# Patient Record
Sex: Female | Born: 1980 | Race: White | Hispanic: No | Marital: Married | Smoking: Never smoker
Health system: Southern US, Community
[De-identification: ages and names within clinical notes are randomized; demographics above are authoritative.]

## PROBLEM LIST (undated history)

## (undated) DIAGNOSIS — M2550 Pain in unspecified joint: Secondary | ICD-10-CM

## (undated) DIAGNOSIS — M25561 Pain in right knee: Secondary | ICD-10-CM

## (undated) DIAGNOSIS — G8929 Other chronic pain: Secondary | ICD-10-CM

## (undated) DIAGNOSIS — Z79899 Other long term (current) drug therapy: Secondary | ICD-10-CM

## (undated) DIAGNOSIS — E063 Autoimmune thyroiditis: Secondary | ICD-10-CM

## (undated) DIAGNOSIS — G43909 Migraine, unspecified, not intractable, without status migrainosus: Secondary | ICD-10-CM

## (undated) HISTORY — PX: APPENDECTOMY: SHX54

## (undated) HISTORY — PX: IUD REMOVAL: SHX5392

## (undated) HISTORY — PX: CHOLECYSTECTOMY: SHX55

## (undated) HISTORY — PX: BACK SURGERY: SHX140

---

## 2002-05-21 ENCOUNTER — Encounter: Payer: Self-pay | Admitting: *Deleted

## 2002-05-21 ENCOUNTER — Inpatient Hospital Stay (HOSPITAL_COMMUNITY): Admission: AD | Admit: 2002-05-21 | Discharge: 2002-05-21 | Payer: Self-pay | Admitting: Obstetrics and Gynecology

## 2002-09-01 ENCOUNTER — Inpatient Hospital Stay (HOSPITAL_COMMUNITY): Admission: AD | Admit: 2002-09-01 | Discharge: 2002-09-01 | Payer: Self-pay | Admitting: *Deleted

## 2003-08-16 ENCOUNTER — Emergency Department (HOSPITAL_COMMUNITY): Admission: AD | Admit: 2003-08-16 | Discharge: 2003-08-16 | Payer: Self-pay | Admitting: Family Medicine

## 2003-10-19 ENCOUNTER — Emergency Department (HOSPITAL_COMMUNITY): Admission: EM | Admit: 2003-10-19 | Discharge: 2003-10-19 | Payer: Self-pay | Admitting: Family Medicine

## 2003-11-01 ENCOUNTER — Emergency Department (HOSPITAL_COMMUNITY): Admission: AD | Admit: 2003-11-01 | Discharge: 2003-11-01 | Payer: Self-pay | Admitting: Family Medicine

## 2003-11-02 ENCOUNTER — Emergency Department (HOSPITAL_COMMUNITY): Admission: EM | Admit: 2003-11-02 | Discharge: 2003-11-03 | Payer: Self-pay | Admitting: Emergency Medicine

## 2003-12-05 ENCOUNTER — Emergency Department (HOSPITAL_COMMUNITY): Admission: EM | Admit: 2003-12-05 | Discharge: 2003-12-05 | Payer: Self-pay | Admitting: Emergency Medicine

## 2004-02-01 ENCOUNTER — Emergency Department (HOSPITAL_COMMUNITY): Admission: EM | Admit: 2004-02-01 | Discharge: 2004-02-01 | Payer: Self-pay

## 2004-03-25 ENCOUNTER — Emergency Department (HOSPITAL_COMMUNITY): Admission: EM | Admit: 2004-03-25 | Discharge: 2004-03-25 | Payer: Self-pay | Admitting: Emergency Medicine

## 2004-05-14 ENCOUNTER — Emergency Department (HOSPITAL_COMMUNITY): Admission: EM | Admit: 2004-05-14 | Discharge: 2004-05-14 | Payer: Self-pay | Admitting: Emergency Medicine

## 2004-07-21 ENCOUNTER — Emergency Department (HOSPITAL_COMMUNITY): Admission: EM | Admit: 2004-07-21 | Discharge: 2004-07-21 | Payer: Self-pay | Admitting: Family Medicine

## 2005-02-17 ENCOUNTER — Inpatient Hospital Stay (HOSPITAL_COMMUNITY): Admission: AD | Admit: 2005-02-17 | Discharge: 2005-02-17 | Payer: Self-pay | Admitting: Family Medicine

## 2005-02-27 ENCOUNTER — Inpatient Hospital Stay (HOSPITAL_COMMUNITY): Admission: AD | Admit: 2005-02-27 | Discharge: 2005-02-28 | Payer: Self-pay | Admitting: Obstetrics & Gynecology

## 2005-11-04 IMAGING — US US OB COMP LESS 14 WK
1 series · 14 of 28 positions shown · non-contrast
Comparison: none

CLINICAL DATA: Early pregnancy.  Abdominal pain. 
 TRANSABDOMINAL AND TRANSVAGINAL OBSTETRICAL ULTRASOUND:
 There is an intrauterine pregnancy with the fetal pole with a crown/rump length of 13.7 mm consistent with 7 weeks, 5 days.   There is cardiac activity with a heart rate of 160 bpm. There is a large subchorionic hemorrhage measuring 4.6 x 0.9 x 2.7 cm.  The right ovary is normal.  There is a 2 cm corpus luteum cyst on the otherwise normal left ovary.  There is a small amount of fluid in the cul-de-sac.

[Series 1: us ob comp less 14 wk · 0.29mm/px · 14 of 50 slices shown]
[im 2/50]
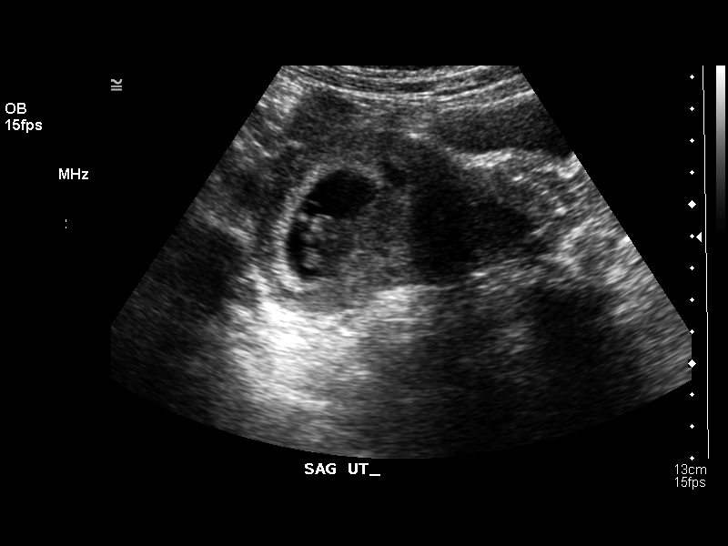
[im 6/50]
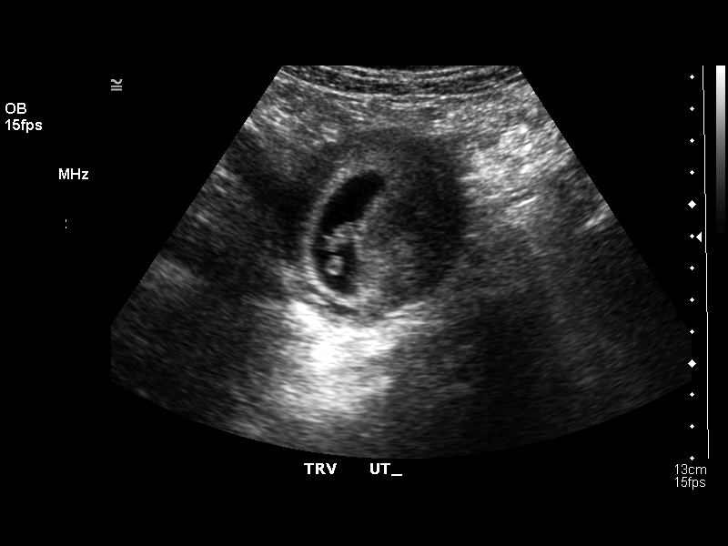
[im 10/50]
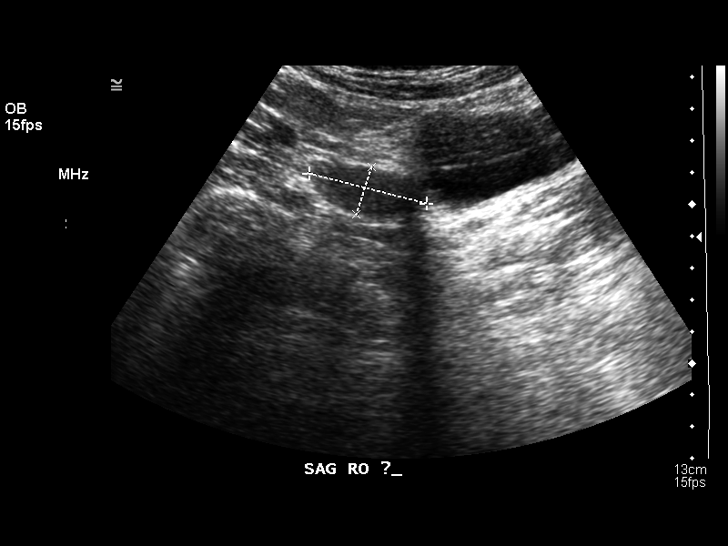
[im 13/50]
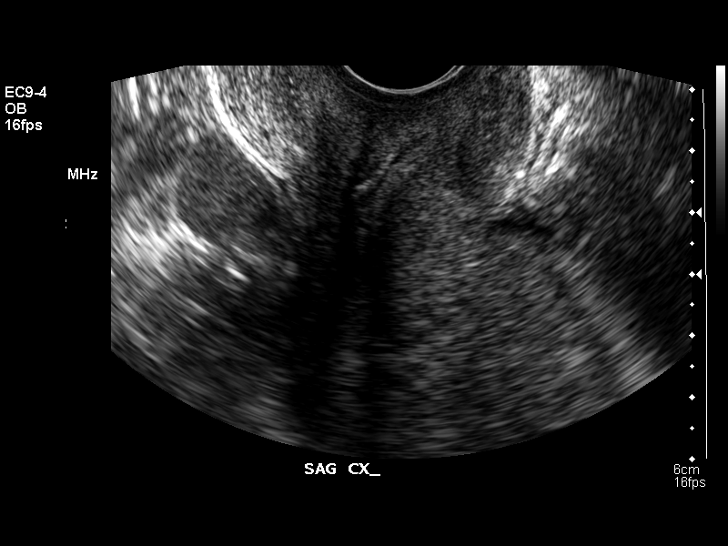
[im 17/50]
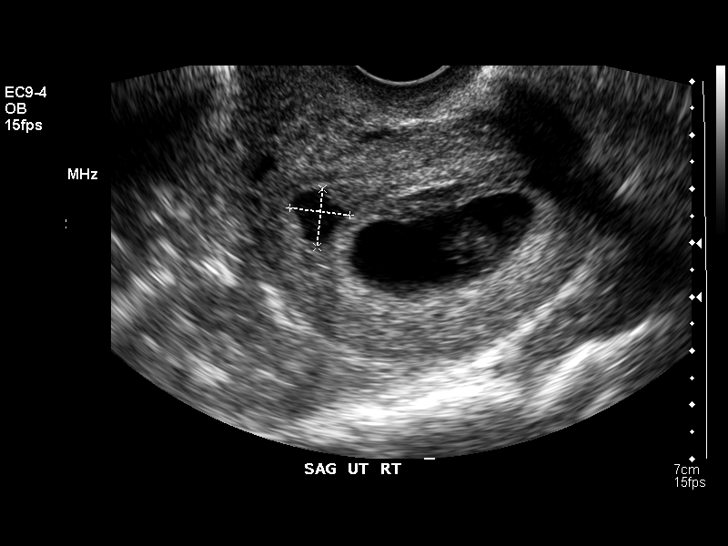
[im 20/50]
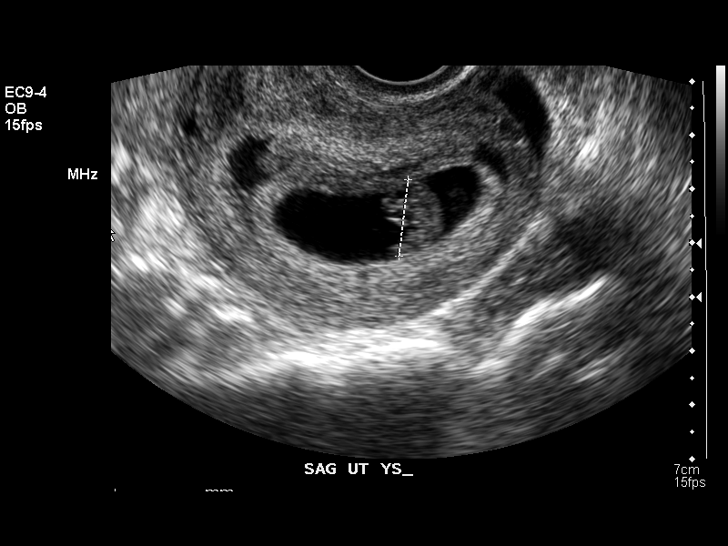
[im 24/50]
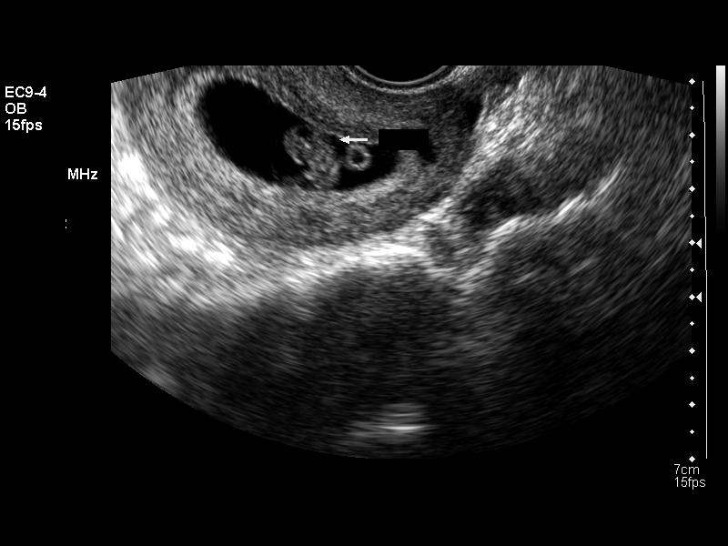
[im 28/50]
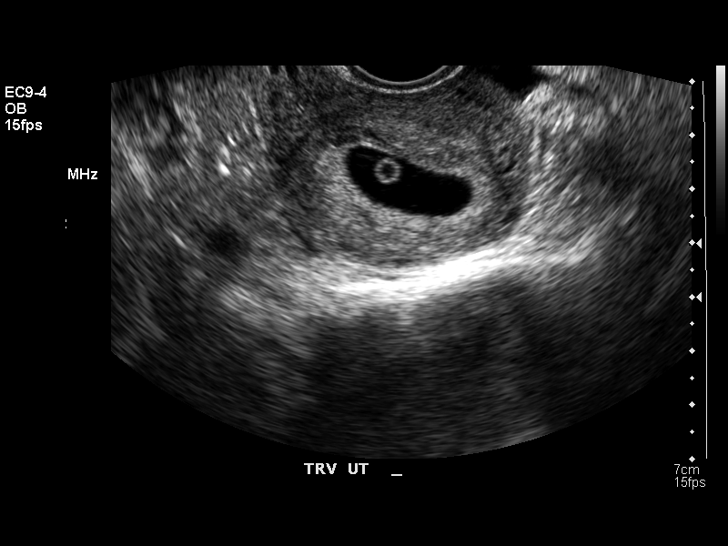
[im 31/50]
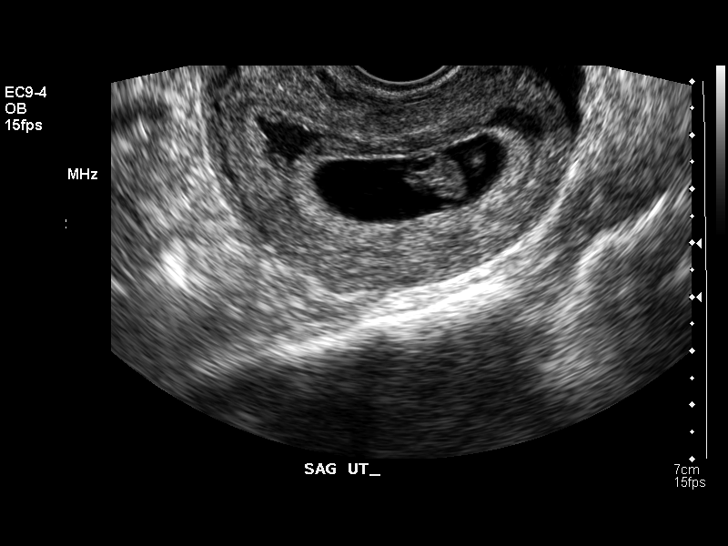
[im 35/50]
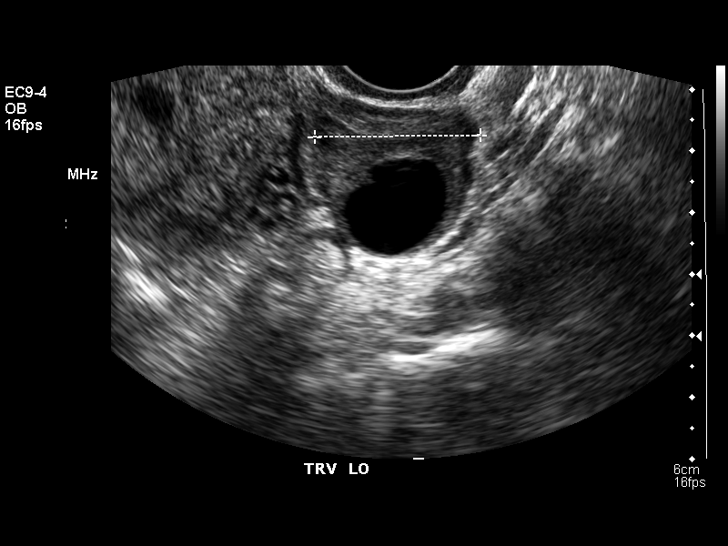
[im 39/50]
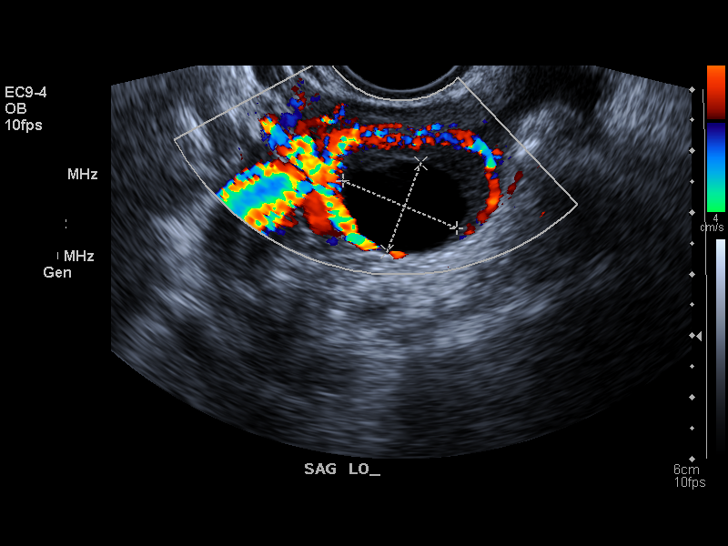
[im 42/50]
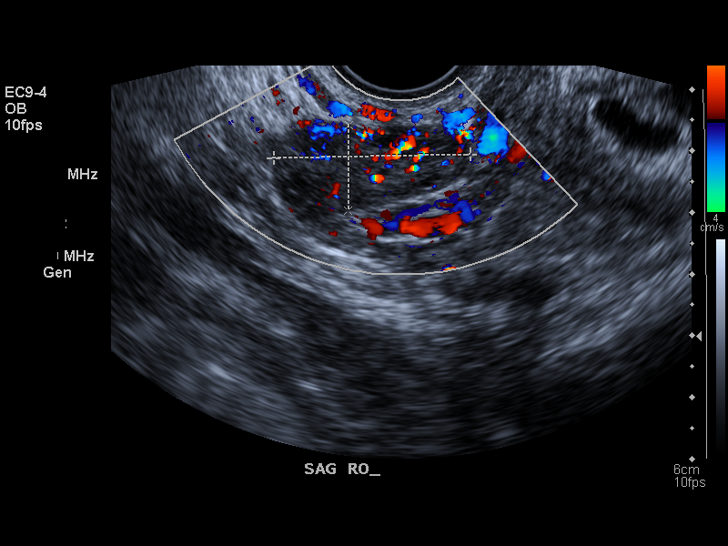
[im 46/50]
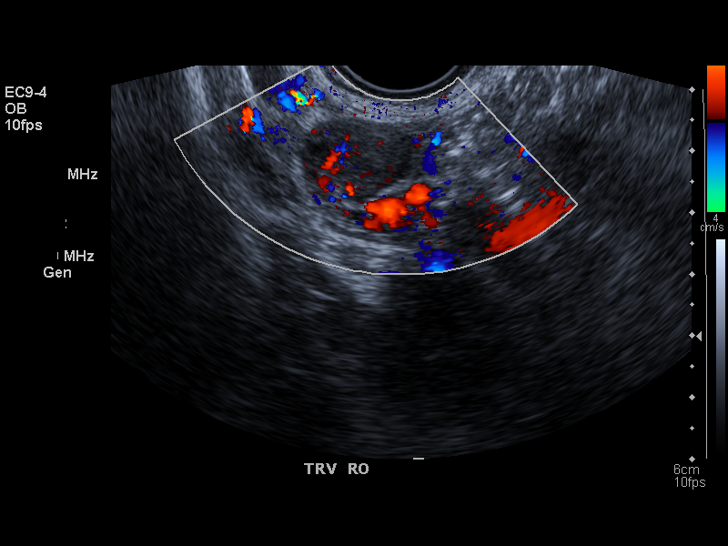
[im 50/50]
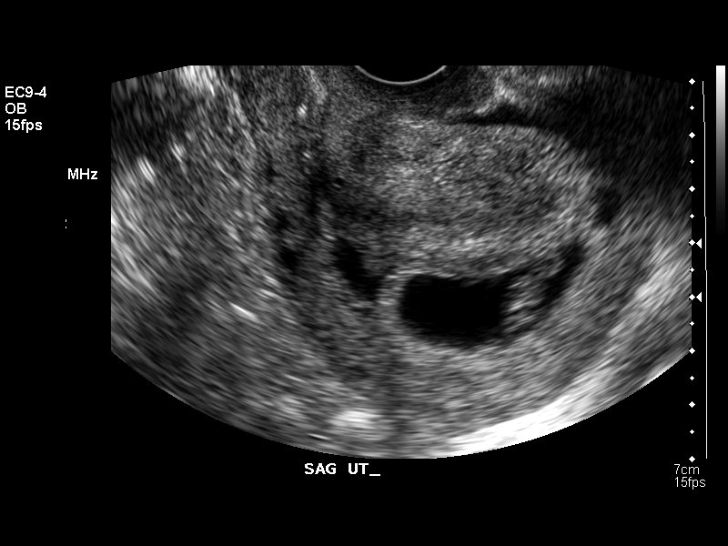

[14 of 28 positions shown; findings below may reference images not displayed]

IMPRESSION: Single intrauterine pregnancy of approximately 7 weeks, 5 days gestation.  Large subchorionic hemorrhage.

## 2006-09-29 ENCOUNTER — Emergency Department (HOSPITAL_COMMUNITY): Admission: EM | Admit: 2006-09-29 | Discharge: 2006-09-29 | Payer: Self-pay | Admitting: Emergency Medicine

## 2007-06-16 IMAGING — CR DG ABDOMEN ACUTE W/ 1V CHEST
3 series · 3 of 3 positions shown · non-contrast
Comparison: None.

CLINICAL DATA: Vomiting. Abdominal pain. 
 ACUTE ABDOMINAL SERIES - 3 VIEW:

[view not recorded (1 of 3)]
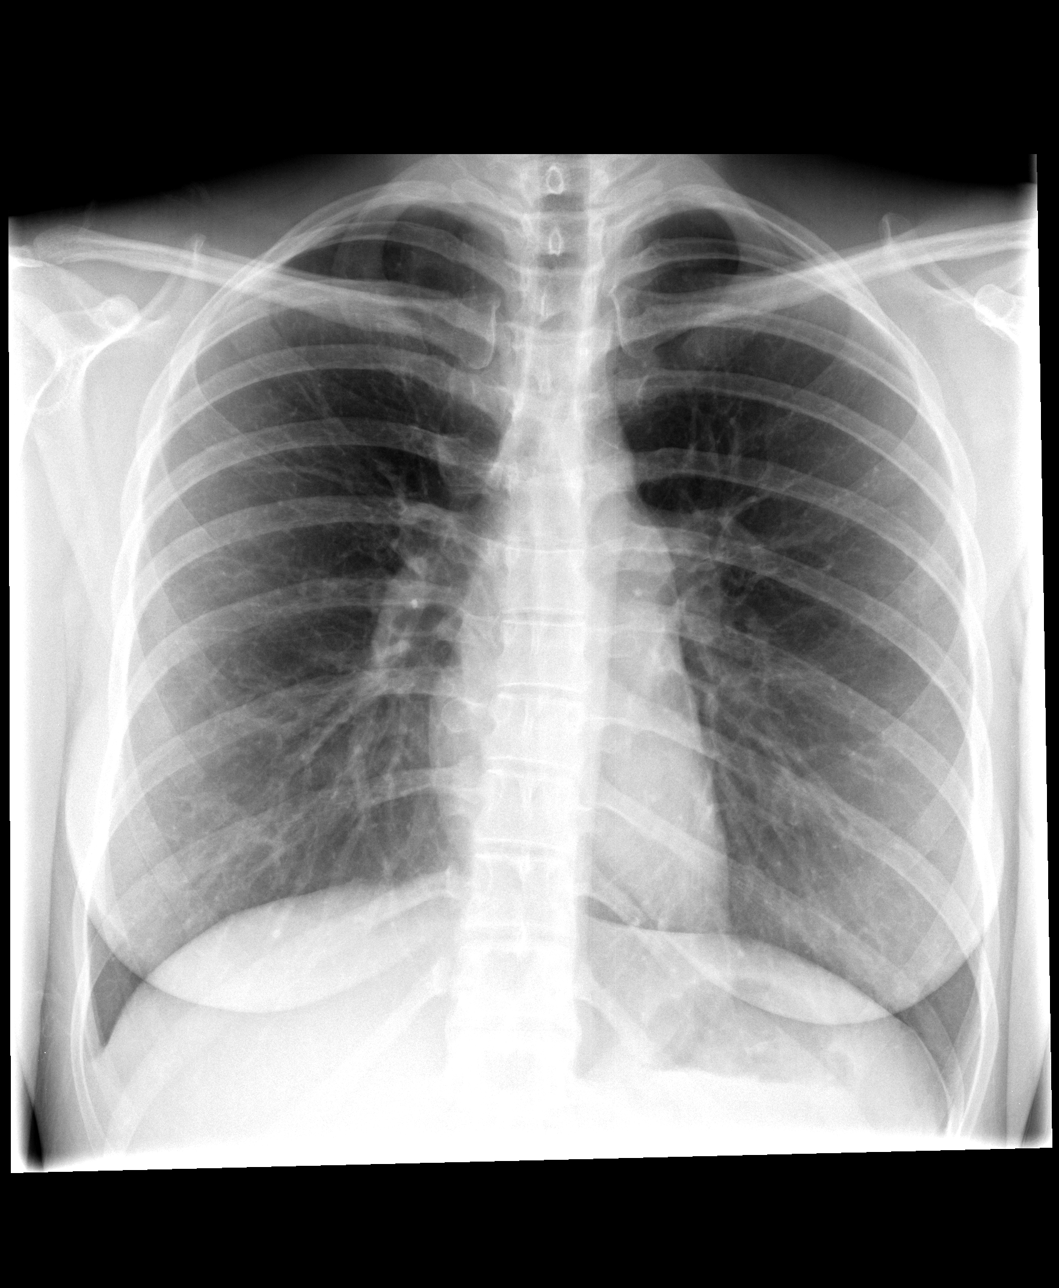

[view not recorded (2 of 3)]
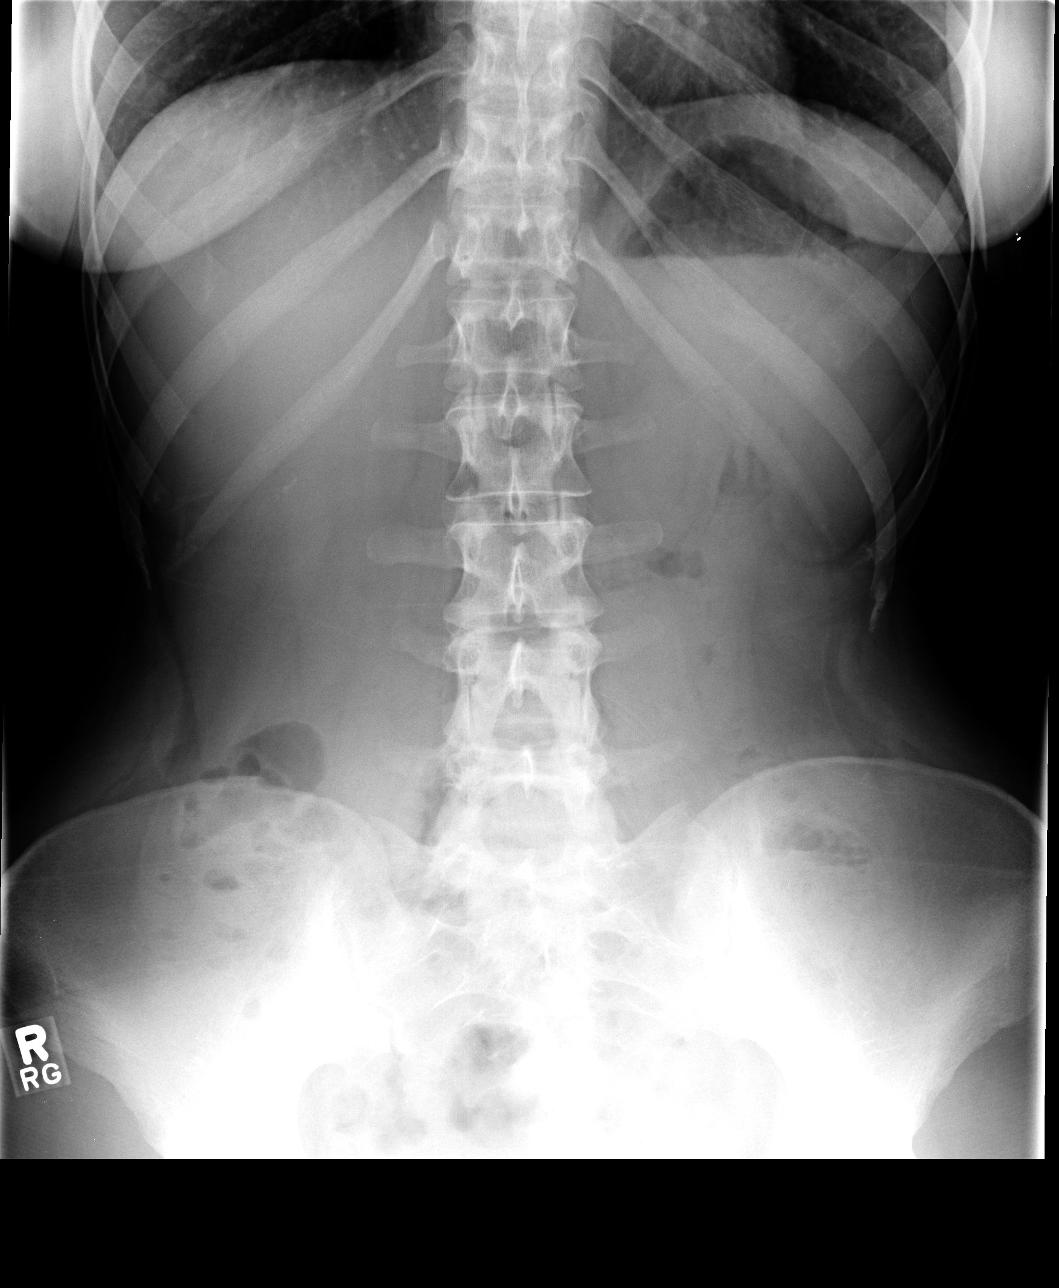

[view not recorded (3 of 3)]
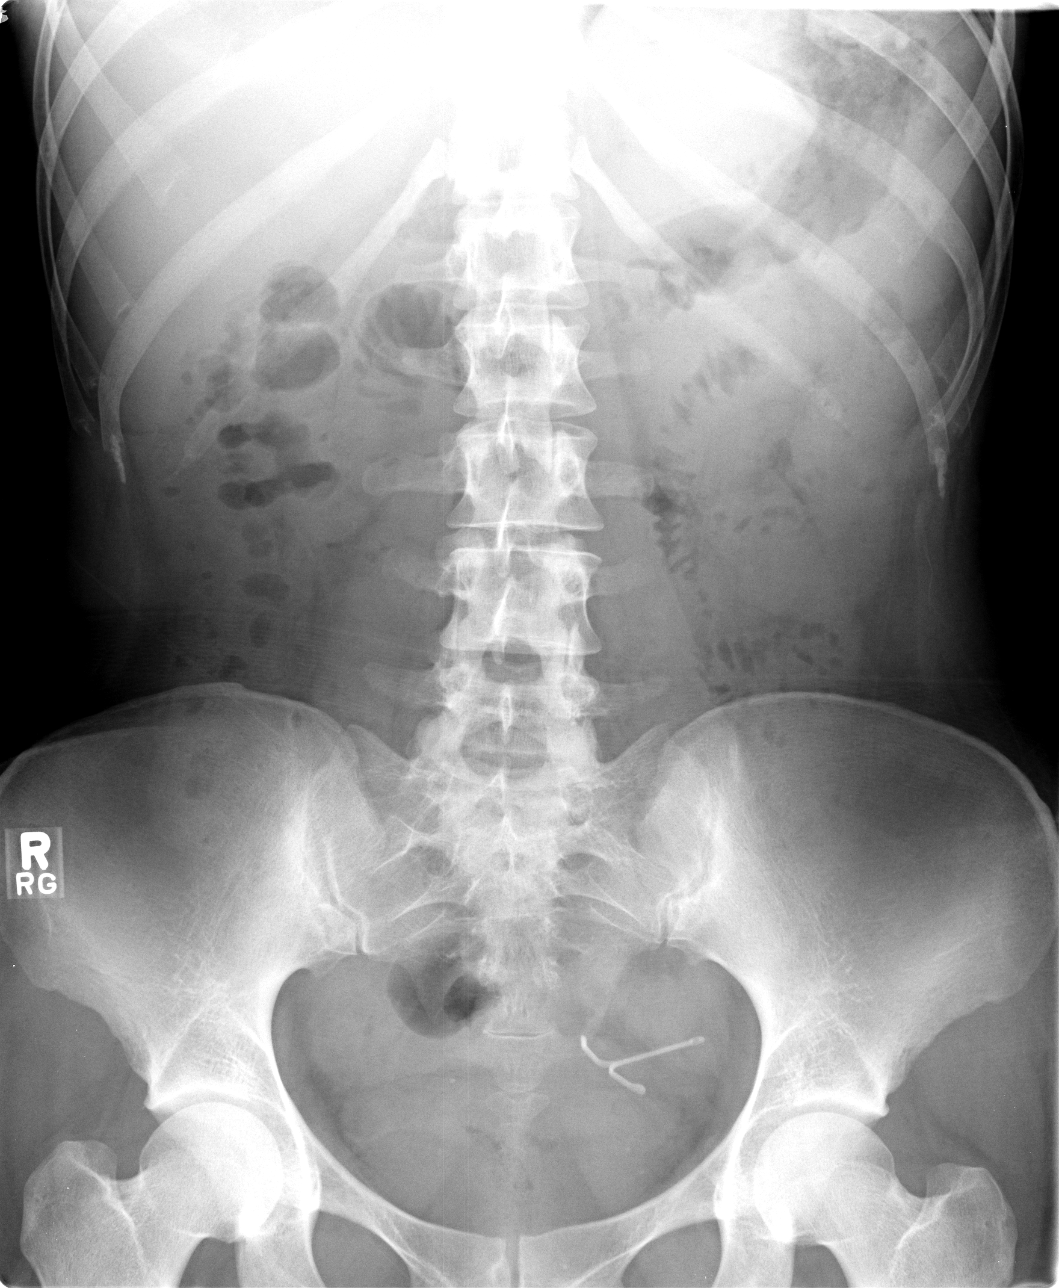

[3 of 3 positions shown; findings below may reference images not displayed]

FINDINGS: Heart size normal. No effusions or edema.  No airspace opacities are identified. 
 There is a nonspecific bowel gas pattern.  A few air-filled loops of bowel measure up to 2.5 cm.  An IUD is seen within the left side of the pelvis. I cannot confirm its intrauterine location.
IMPRESSION: 1.  Nonspecific bowel gas pattern.
 2.  IUD in left hemipelvis ? cannot confirm location.  If there is a concern for migration of the IUD, I would recommend a pelvic sonogram be performed.

## 2010-07-20 ENCOUNTER — Inpatient Hospital Stay (HOSPITAL_COMMUNITY)
Admission: AD | Admit: 2010-07-20 | Discharge: 2010-07-21 | Payer: Self-pay | Source: Home / Self Care | Admitting: Obstetrics & Gynecology

## 2010-11-10 LAB — URINALYSIS, ROUTINE W REFLEX MICROSCOPIC
Bilirubin Urine: NEGATIVE
Glucose, UA: NEGATIVE mg/dL
Hgb urine dipstick: NEGATIVE
Ketones, ur: 15 mg/dL — AB
Nitrite: NEGATIVE
Protein, ur: NEGATIVE mg/dL
Specific Gravity, Urine: <1.005 — ABNORMAL LOW (ref 1.005–1.030)
Urobilinogen, UA: 0.2 mg/dL (ref 0.0–1.0)
pH: 6.5 (ref 5.0–8.0)

## 2010-11-10 LAB — GC/CHLAMYDIA PROBE AMP, GENITAL
Chlamydia, DNA Probe: NEGATIVE
GC Probe Amp, Genital: NEGATIVE

## 2010-11-10 LAB — WET PREP, GENITAL
Clue Cells Wet Prep HPF POC: NONE SEEN
Trich, Wet Prep: NONE SEEN
Yeast Wet Prep HPF POC: NONE SEEN

## 2011-11-17 ENCOUNTER — Encounter (HOSPITAL_BASED_OUTPATIENT_CLINIC_OR_DEPARTMENT_OTHER): Payer: Self-pay | Admitting: *Deleted

## 2011-11-17 ENCOUNTER — Emergency Department (INDEPENDENT_AMBULATORY_CARE_PROVIDER_SITE_OTHER)

## 2011-11-17 ENCOUNTER — Emergency Department (HOSPITAL_BASED_OUTPATIENT_CLINIC_OR_DEPARTMENT_OTHER)
Admission: EM | Admit: 2011-11-17 | Discharge: 2011-11-17 | Disposition: A | Discharge status: Home / Self Care | Attending: Emergency Medicine | Admitting: Emergency Medicine

## 2011-11-17 DIAGNOSIS — W230XXA Caught, crushed, jammed, or pinched between moving objects, initial encounter: Secondary | ICD-10-CM

## 2011-11-17 DIAGNOSIS — S63509A Unspecified sprain of unspecified wrist, initial encounter: Secondary | ICD-10-CM | POA: Insufficient documentation

## 2011-11-17 DIAGNOSIS — M79609 Pain in unspecified limb: Secondary | ICD-10-CM | POA: Insufficient documentation

## 2011-11-17 DIAGNOSIS — S6390XA Sprain of unspecified part of unspecified wrist and hand, initial encounter: Secondary | ICD-10-CM

## 2011-11-17 DIAGNOSIS — X500XXA Overexertion from strenuous movement or load, initial encounter: Secondary | ICD-10-CM | POA: Insufficient documentation

## 2011-11-17 DIAGNOSIS — S6990XA Unspecified injury of unspecified wrist, hand and finger(s), initial encounter: Secondary | ICD-10-CM

## 2011-11-17 MED ORDER — IBUPROFEN 800 MG PO TABS: 800.0000 mg | ORAL_TABLET | Freq: Three times a day (TID) | ORAL | Status: AC

## 2011-11-17 NOTE — ED Provider Notes (Signed)
Medical screening examination/treatment/procedure(s) were performed by non-physician practitioner and as supervising physician I was immediately available for consultation/collaboration.    Nelia Shi, MD 11/17/11 1346

## 2011-11-17 NOTE — ED Provider Notes (Signed)
History     CSN: [id number]  Arrival date & time 11/17/11  1235   First MD Initiated Contact with Patient 11/17/11 1249      Chief Complaint  Patient presents with  . Hand Pain    right    (Consider location/radiation/quality/duration/timing/severity/associated sxs/prior treatment) HPI Comments: Pt states that she was moving furniture and it slipped and now her right hand is hurting along the first 3 digits:pain to the dorsal aspect with movement  Patient is a 31 y.o. female presenting with hand pain. The history is provided by the patient. No language interpreter was used.  Hand Pain This is a new problem. The current episode started in the past 7 days. The problem occurs constantly. The problem has been unchanged. Pertinent negatives include no numbness. The symptoms are aggravated by bending. She has tried nothing for the symptoms.    History reviewed. No pertinent past medical history.  Past Surgical History  Procedure Date  . Iud removal   . Appendectomy     No family history on file.  History  Substance Use Topics  . Smoking status: Never Smoker   . Smokeless tobacco: Not on file  . Alcohol Use: Yes     occassionally    OB History    Grav Para Term Preterm Abortions TAB SAB Ect Mult Living                  Review of Systems  Neurological: Negative for numbness.  All other systems reviewed and are negative.    Allergies  Sulfa antibiotics  Home Medications   Current Outpatient Rx  Name Route Sig Dispense Refill  . BEE POLLEN PO Oral Take 250 mg by mouth.    . NORETHINDRONE ACET-ETHINYL EST 1-20 MG-MCG PO TABS Oral Take 1 tablet by mouth daily.      BP 120/80  Pulse 84  Temp(Src) 97.8 F (36.6 C) (Oral)  Resp 20  Ht 5\' 6"  (1.676 m)  Wt 160 lb (72.576 kg)  BMI 25.82 kg/m2  SpO2 100%  LMP 10/20/2011  Physical Exam  Nursing note and vitals reviewed. Constitutional: She is oriented to person, place, and time. She appears well-developed and  well-nourished.  HENT:  Head: Atraumatic.  Cardiovascular: Normal rate and regular rhythm.   Pulmonary/Chest: Effort normal and breath sounds normal.  Musculoskeletal: Normal range of motion.       Pt tender to the dorsal aspect of the right hand along the first 3 digits:no swelling or deformity noted:pulses intact  Neurological: She is alert and oriented to person, place, and time.  Skin: Skin is dry.    ED Course  Procedures (including critical care time)  Labs Reviewed - No data to display Dg Hand Complete Right  11/17/2011  *RADIOLOGY REPORT*  Clinical Data: Crush injury.  Pain and limited range of motion.  RIGHT HAND - COMPLETE 3+ VIEW  Comparison: None.  Findings: No evidence of fracture, dislocation or other focal lesion.  IMPRESSION: Negative radiographs  Original Report Authenticated By: Thomasenia Sales, M.D.     1. Hand sprain       MDM  No acute finding:pt asking for splint for comfort:pt okay to treat with ibuprofen at home       Teressa Lower, NP 11/17/11 1341

## 2011-11-17 NOTE — ED Notes (Signed)
Patient states she was lifting a dresser 3 days ago and it slipped and she tried to catch it.  Now presents with pain and swelling on her right dorsal hand and pain in her index and middle right fingers.

## 2011-11-17 NOTE — Discharge Instructions (Signed)
Hand Injuries There are many types of hand injuries. You or your child may have a minor broken bone (fracture), sprain, bruises, or burns on the hand. HOME CARE  Keep the hand raised (elevated) above the level of the heart. Do this for the first few days until the pain and puffiness (swelling) get better.   Hand bandages (dressings) and splints are used to keep the hand still. This helps with pain and prevents more injury.   Do not remove the bandage and splint until your doctor says it is okay.   For broken bones, sprains, and bruises, you may put ice on the injured area.   Put ice in a plastic bag.   Place a towel between the skin and the bag.   Leave the ice on for 15 to 20 minutes every few hours. Do this for 2 to 3 days.   Medicine for pain and redness or puffiness (inflammation) is often helpful.   Some hand motion after an injury can decrease stiffness.   Do not do any activities that increase pain in the hand.   See your doctor for follow-up care as told.  GET HELP RIGHT AWAY IF:   The pain and puffiness get worse.   The pain is not controlled with medicine.   You or your child has a temperature by mouth above 102 F (38.9 C), not controlled by medicine.   There is pain when moving the fingers.   There is fluid (pus) coming from the wound.  MAKE SURE YOU:   Understand these instructions.   Will watch this condition.   Will get help right away if you or your child is not doing well or gets worse.  Document Released: 11/10/2009 Document Revised: 08/05/2011 Document Reviewed: 11/10/2009 [facility] Patient Information 2012 [place], [place].

## 2015-05-15 ENCOUNTER — Encounter (HOSPITAL_BASED_OUTPATIENT_CLINIC_OR_DEPARTMENT_OTHER): Payer: Self-pay | Admitting: Emergency Medicine

## 2015-05-15 DIAGNOSIS — Z79899 Other long term (current) drug therapy: Secondary | ICD-10-CM | POA: Diagnosis not present

## 2015-05-15 DIAGNOSIS — K602 Anal fissure, unspecified: Secondary | ICD-10-CM | POA: Insufficient documentation

## 2015-05-15 DIAGNOSIS — Z8679 Personal history of other diseases of the circulatory system: Secondary | ICD-10-CM | POA: Diagnosis not present

## 2015-05-15 DIAGNOSIS — G8929 Other chronic pain: Secondary | ICD-10-CM | POA: Insufficient documentation

## 2015-05-15 DIAGNOSIS — E063 Autoimmune thyroiditis: Secondary | ICD-10-CM | POA: Diagnosis not present

## 2015-05-15 DIAGNOSIS — K625 Hemorrhage of anus and rectum: Secondary | ICD-10-CM | POA: Diagnosis present

## 2015-05-16 ENCOUNTER — Emergency Department (HOSPITAL_BASED_OUTPATIENT_CLINIC_OR_DEPARTMENT_OTHER)
Admission: EM | Admit: 2015-05-16 | Discharge: 2015-05-16 | Disposition: A | Discharge status: Home / Self Care | Attending: Emergency Medicine | Admitting: Emergency Medicine

## 2015-05-16 ENCOUNTER — Encounter (HOSPITAL_BASED_OUTPATIENT_CLINIC_OR_DEPARTMENT_OTHER): Payer: Self-pay | Admitting: Emergency Medicine

## 2015-05-16 DIAGNOSIS — K602 Anal fissure, unspecified: Secondary | ICD-10-CM

## 2015-05-16 HISTORY — DX: Migraine, unspecified, not intractable, without status migrainosus: G43.909

## 2015-05-16 HISTORY — DX: Pain in unspecified joint: M25.50

## 2015-05-16 HISTORY — DX: Other chronic pain: G89.29

## 2015-05-16 HISTORY — DX: Other long term (current) drug therapy: Z79.899

## 2015-05-16 HISTORY — DX: Pain in right knee: M25.561

## 2015-05-16 HISTORY — DX: Autoimmune thyroiditis: E06.3

## 2015-05-16 LAB — OCCULT BLOOD X 1 CARD TO LAB, STOOL: Fecal Occult Bld: NEGATIVE

## 2015-05-16 NOTE — ED Notes (Addendum)
C/o 2 loose bright red  bloody stools today w mild abd pain after having bmstools

## 2015-05-16 NOTE — Discharge Instructions (Signed)
Anal Fissure, Adult An anal fissure is a small tear or crack in the skin around the anus. Bleeding from a fissure usually stops on its own within a few minutes. However, bleeding will often reoccur with each bowel movement until the crack heals.  CAUSES   Passing large, hard stools.  Frequent diarrheal stools.  Constipation.  Inflammatory bowel disease (Crohn's disease or ulcerative colitis).  Infections.  Anal sex. SYMPTOMS   Small amounts of blood seen on your stools, on toilet paper, or in the toilet after a bowel movement.  Rectal bleeding.  Painful bowel movements.  Itching or irritation around the anus. DIAGNOSIS Your caregiver will examine the anal area. An anal fissure can usually be seen with careful inspection. A rectal exam may be performed and a short tube (anoscope) may be used to examine the anal canal. TREATMENT   You may be instructed to take fiber supplements. These supplements can soften your stool to help make bowel movements easier.  Sitz baths may be recommended to help heal the tear. Do not use soap in the sitz baths.  A medicated cream or ointment may be prescribed to lessen discomfort. HOME CARE INSTRUCTIONS   Maintain a diet high in fruits, whole grains, and vegetables. Avoid constipating foods like bananas and dairy products.  Take sitz baths as directed by your caregiver.  Drink enough fluids to keep your urine clear or pale yellow.  Only take over-the-counter or prescription medicines for pain, discomfort, or fever as directed by your caregiver. Do not take aspirin as this may increase bleeding.  Do not use ointments containing numbing medications (anesthetics) or hydrocortisone. They could slow healing. SEEK MEDICAL CARE IF:   Your fissure is not completely healed within 3 days.  You have further bleeding.  You have a fever.  You have diarrhea mixed with blood.  You have pain.  Your problem is getting worse rather than  better. MAKE SURE YOU:   Understand these instructions.  Will watch your condition.  Will get help right away if you are not doing well or get worse. Document Released: 08/16/2005 Document Revised: 11/08/2011 Document Reviewed: 01/31/2011 [employer] Patient Information 2015 [employer]. This information is not intended to replace advice given to you by your health care provider. Make sure you discuss any questions you have with your health care provider.  

## 2015-05-16 NOTE — ED Notes (Signed)
Pt reports bright red rectal bleeding x 6 hours reports blood and mucous mixed in with stools. Denies Abd pain N/V/D

## 2015-05-16 NOTE — ED Provider Notes (Signed)
CSN: [id number]     Arrival date & time 05/15/15  2355 History   First MD Initiated Contact with Patient 05/16/15 0215     Chief Complaint  Patient presents with  . Rectal Bleeding     (Consider location/radiation/quality/duration/timing/severity/associated sxs/prior Treatment) Patient is a 34 y.o. female presenting with hematochezia. The history is provided by the patient.  Rectal Bleeding Quality:  Bright red Amount:  Scant Timing:  Rare Progression:  Resolved Chronicity:  New Context: not anal penetration, not constipation, not diarrhea, not rectal injury and not rectal pain   Relieved by:  Nothing Worsened by:  Nothing tried Ineffective treatments:  None tried Associated symptoms: no abdominal pain, no fever and no vomiting   Risk factors: no anticoagulant use, no hx of IBD and no NSAID use   Blood on toilet tissue post BMs this evening.  No melena, no pain, no f/c/r.    Past Medical History  Diagnosis Date  . Chronic pain of right knee   . Hashimoto's thyroiditis   . Joint pain   . Migraine   . Controlled substance agreement signed    Past Surgical History  Procedure Laterality Date  . Iud removal    . Appendectomy    . Cholecystectomy    . Back surgery     History reviewed. No pertinent family history. Social History  Substance Use Topics  . Smoking status: Never Smoker   . Smokeless tobacco: None  . Alcohol Use: No     Comment: occassionally   OB History    No data available     Review of Systems  Constitutional: Negative for fever.  Gastrointestinal: Positive for hematochezia. Negative for vomiting and abdominal pain.  All other systems reviewed and are negative.     Allergies  Sulfa antibiotics  Home Medications   Prior to Admission medications   Medication Sig Start Date End Date Taking? Authorizing Provider  eszopiclone (LUNESTA) 1 MG TABS tablet Take 1 mg by mouth at bedtime as needed for sleep. Take immediately before bedtime   Yes  Historical Provider, MD  levothyroxine (SYNTHROID, LEVOTHROID) 88 MCG tablet Take 88 mcg by mouth daily before breakfast.   Yes Historical Provider, MD  BEE POLLEN PO Take 250 mg by mouth.    Historical Provider, MD  norethindrone-ethinyl estradiol (MICROGESTIN,JUNEL,LOESTRIN) 1-20 MG-MCG tablet Take 1 tablet by mouth daily.    Historical Provider, MD   BP 100/58 mmHg  Pulse 75  Temp(Src) 98.1 F (36.7 C) (Oral)  Resp 16  Wt 166 lb (75.297 kg)  SpO2 99%  LMP 05/02/2015 Physical Exam  Constitutional: She is oriented to person, place, and time. She appears well-developed and well-nourished. No distress.  Well appearing   HENT:  Head: Normocephalic and atraumatic.  Mouth/Throat: Oropharynx is clear and moist.  Eyes: Conjunctivae are normal. Pupils are equal, round, and reactive to light.  Neck: Normal range of motion. Neck supple.  Cardiovascular: Normal rate, regular rhythm and intact distal pulses.   Pulmonary/Chest: Effort normal and breath sounds normal. She has no wheezes. She has no rales.  Abdominal: Soft. Bowel sounds are normal. She exhibits no distension and no mass. There is no tenderness. There is no rebound and no guarding.  Genitourinary: Guaiac negative stool.  Small fissure.  Adequate stool specimen normal color, chaperone present  Musculoskeletal: Normal range of motion.  Neurological: She is alert and oriented to person, place, and time.  Skin: Skin is warm and dry. No pallor.  Psychiatric: She has  a normal mood and affect.    ED Course  Procedures (including critical care time) Labs Review Labs Reviewed  OCCULT BLOOD X 1 CARD TO LAB, STOOL    Imaging Review No results found. I have personally reviewed and evaluated these images and lab results as part of my medical decision-making.   EKG Interpretation None      MDM   Final diagnoses:  Anal fissure   Saw a pic of the blood scan blood on the tissue.    Negative guaiac, normal stool in the rectal  vault benign exam and labs.  No indication for labs or imaging at this time.  Stool softener and baby wipes patient has a follow up this week with GI.  Strict return precautions given     April Palumbo, MD 05/16/15 [phone]

## 2016-02-26 ENCOUNTER — Other Ambulatory Visit: Payer: Self-pay | Admitting: Orthopedic Surgery

## 2016-02-26 DIAGNOSIS — S83004S Unspecified dislocation of right patella, sequela: Secondary | ICD-10-CM

## 2016-03-26 ENCOUNTER — Ambulatory Visit
Admission: RE | Admit: 2016-03-26 | Discharge: 2016-03-26 | Disposition: A | Discharge status: Home / Self Care | Source: Ambulatory Visit | Attending: Orthopedic Surgery | Admitting: Orthopedic Surgery

## 2016-03-26 DIAGNOSIS — S83004S Unspecified dislocation of right patella, sequela: Secondary | ICD-10-CM

## 2018-02-21 ENCOUNTER — Other Ambulatory Visit (HOSPITAL_COMMUNITY): Payer: Self-pay | Admitting: Orthopedic Surgery

## 2018-02-21 DIAGNOSIS — M1711 Unilateral primary osteoarthritis, right knee: Secondary | ICD-10-CM

## 2020-05-09 ENCOUNTER — Inpatient Hospital Stay: Attending: Oncology

## 2020-05-09 ENCOUNTER — Other Ambulatory Visit: Payer: Self-pay

## 2020-05-09 ENCOUNTER — Telehealth: Payer: Self-pay

## 2020-05-09 DIAGNOSIS — Z23 Encounter for immunization: Secondary | ICD-10-CM

## 2020-05-09 NOTE — Telephone Encounter (Signed)
° °  Covid-19 Vaccination Clinic  Name:  [name]    MRN: [id number] DOB: 1980/10/10  05/09/2020  Ms. Fleisher was observed post Covid-19 immunization for 15 minutes without incident. She was provided with Vaccine Information Sheet and instruction to access the V-Safe system.   Ms. Reckner was instructed to call 911 with any severe reactions post vaccine:  Difficulty breathing   Swelling of face and throat   A fast heartbeat   A bad rash all over body   Dizziness and weakness

## 2020-05-30 ENCOUNTER — Inpatient Hospital Stay: Attending: Oncology

## 2020-05-30 DIAGNOSIS — Z23 Encounter for immunization: Secondary | ICD-10-CM | POA: Insufficient documentation

## 2020-06-06 ENCOUNTER — Ambulatory Visit

## 2020-06-06 DIAGNOSIS — Z23 Encounter for immunization: Secondary | ICD-10-CM | POA: Diagnosis present

## 2023-02-21 ENCOUNTER — Other Ambulatory Visit: Payer: Self-pay

## 2023-02-21 ENCOUNTER — Encounter (HOSPITAL_BASED_OUTPATIENT_CLINIC_OR_DEPARTMENT_OTHER): Payer: Self-pay | Admitting: Emergency Medicine

## 2023-02-21 ENCOUNTER — Emergency Department (HOSPITAL_BASED_OUTPATIENT_CLINIC_OR_DEPARTMENT_OTHER)
Admission: EM | Admit: 2023-02-21 | Discharge: 2023-02-21 | Disposition: A | Discharge status: Home / Self Care | Attending: Emergency Medicine | Admitting: Emergency Medicine

## 2023-02-21 DIAGNOSIS — R519 Headache, unspecified: Secondary | ICD-10-CM | POA: Diagnosis not present

## 2023-02-21 DIAGNOSIS — G43809 Other migraine, not intractable, without status migrainosus: Secondary | ICD-10-CM

## 2023-02-21 DIAGNOSIS — R112 Nausea with vomiting, unspecified: Secondary | ICD-10-CM | POA: Diagnosis present

## 2023-02-21 MED ORDER — SODIUM CHLORIDE 0.9 % IV BOLUS
1000.0000 mL | Freq: Once | INTRAVENOUS | Status: AC
  Administered 2023-02-21: 1000 mL via INTRAVENOUS

## 2023-02-21 MED ORDER — DEXAMETHASONE SODIUM PHOSPHATE 10 MG/ML IJ SOLN
10.0000 mg | Freq: Once | INTRAMUSCULAR | Status: AC
  Administered 2023-02-21: 10 mg via INTRAVENOUS
  Filled 2023-02-21: qty 1

## 2023-02-21 MED ORDER — DIPHENHYDRAMINE HCL 50 MG/ML IJ SOLN
25.0000 mg | Freq: Once | INTRAMUSCULAR | Status: AC
  Administered 2023-02-21: 25 mg via INTRAVENOUS
  Filled 2023-02-21: qty 1

## 2023-02-21 MED ORDER — METOCLOPRAMIDE HCL 10 MG PO TABS: 10.0000 mg | ORAL_TABLET | Freq: Four times a day (QID) | ORAL | 0 refills | Status: AC

## 2023-02-21 MED ORDER — MAGNESIUM SULFATE 2 GM/50ML IV SOLN
2.0000 g | Freq: Once | INTRAVENOUS | Status: AC
  Administered 2023-02-21: 2 g via INTRAVENOUS
  Filled 2023-02-21: qty 50

## 2023-02-21 MED ORDER — PROCHLORPERAZINE EDISYLATE 10 MG/2ML IJ SOLN
10.0000 mg | Freq: Once | INTRAMUSCULAR | Status: AC
  Administered 2023-02-21: 10 mg via INTRAVENOUS
  Filled 2023-02-21: qty 2

## 2023-02-21 MED ORDER — KETOROLAC TROMETHAMINE 15 MG/ML IJ SOLN
15.0000 mg | Freq: Once | INTRAMUSCULAR | Status: AC
  Administered 2023-02-21: 15 mg via INTRAVENOUS
  Filled 2023-02-21: qty 1

## 2023-02-21 NOTE — ED Notes (Signed)
Pt reports migraine x 1 day has taken her prescription meds with no relief.  C/o photosensitivity and nausea

## 2023-02-21 NOTE — Discharge Instructions (Signed)
You were seen for your headache in the emergency department.  You were given medicines which improved your symptoms.  At home, please take Tylenol and ibuprofen for your headache.  You may also take the Reglan we have prescribed you for your headache or any nausea or vomiting that you have.  You may take this with Benadryl for severe headaches but please note that this will make you drowsy.    Check your MyChart online for the results of any tests that had not resulted by the time you left the emergency department.   Follow-up with your primary doctor in 2-3 days regarding your visit.    Return immediately to the emergency department if you experience any of the following: Vision changes, numbness or weakness of your arms or legs, or any other concerning symptoms.    Thank you for visiting our Emergency Department. It was a pleasure taking care of you today.   

## 2023-02-21 NOTE — ED Triage Notes (Signed)
Patient arrived via POV c/o migraine x 1 day. Patient states nausea with 1 episode of emesis. Patient is AO x 4, VS WDL , normal gait.

## 2023-02-21 NOTE — ED Provider Notes (Signed)
  Physical Exam  BP 111/83 (BP Location: Left Arm)   Pulse 93   Temp 98 F (36.7 C)   Resp 20   Ht 5\' 6"  (1.676 m)   Wt 81.6 kg   LMP 02/21/2023 (Exact Date)   SpO2 95%   BMI 29.05 kg/m   Physical Exam  Procedures  Procedures  ED Course / MDM   Clinical Course as of 02/21/23 2223  Mon Feb 21, 2023  2143 Assumed primary care of the patient.  42 year old female with a history of migraines who presents to the emergency department with headache.  This is typical of her migraines.  No other red flag symptoms.  Patient has refused lab work and hCG.  Was given a migraine cocktail. [RP]  2222 Patient reassessed and is feeling much better.  Says that she is ready to go home at this time.  Repeat neurologic exam is nonfocal she is not having any nuchal rigidity and reports that her headache is consistent with priors.  Says that she has her headache medication at home but will give her a prescription of Reglan as well for any nausea or vomiting that she may have.  Will have her follow-up with her primary doctor and neurologist as an outpatient. [RP]    Clinical Course User Index [RP] Rondel Baton, MD   Medical Decision Making Risk Prescription drug management.      Rondel Baton, MD 02/21/23 2223

## 2023-02-21 NOTE — ED Provider Notes (Signed)
[facility] Provider Note   CSN: [id number] Arrival date & time: 02/21/23  1956     History  Chief Complaint  Patient presents with   Migraine   Nausea    Natalie Malone is a 42 y.o. female with past medical history significant for chronic migraines who presents with migraine that began yesterday associated with photosensitivity, nausea, vomiting.  She reports that the migraine began gradually, feels overall similar to her normal migraines although they are often only left-sided this 1 is bilateral.  Patient reports that she has taken her home baclofen, Maxalt, she had a recent Aimovig injection, and she also had recent Botox injections.  Patient denies any new numbness, tingling, double vision, confusion, ataxic gait.  She denies any recent head injury.   Migraine       Home Medications Prior to Admission medications   Medication Sig Start Date End Date Taking? Authorizing Provider  BEE POLLEN PO Take 250 mg by mouth.    [provider]  eszopiclone (LUNESTA) 1 MG TABS tablet Take 1 mg by mouth at bedtime as needed for sleep. Take immediately before bedtime    [provider]  levothyroxine (SYNTHROID, LEVOTHROID) 88 MCG tablet Take 88 mcg by mouth daily before breakfast.    [provider]  norethindrone-ethinyl estradiol (MICROGESTIN,JUNEL,LOESTRIN) 1-20 MG-MCG tablet Take 1 tablet by mouth daily.    [provider]      Allergies    Sulfa antibiotics    Review of Systems   Review of Systems  All other systems reviewed and are negative.   Physical Exam Updated Vital Signs BP 111/83 (BP Location: Left Arm)   Pulse 93   Temp 98 F (36.7 C)   Resp 20   Ht 5\' 6"  (1.676 m)   Wt 81.6 kg   LMP 02/21/2023 (Exact Date)   SpO2 95%   BMI 29.05 kg/m  Physical Exam Vitals and nursing note reviewed.  Constitutional:      General: She is not in acute distress.    Appearance: Normal  appearance.  HENT:     Head: Normocephalic and atraumatic.  Eyes:     General:        Right eye: No discharge.        Left eye: No discharge.  Cardiovascular:     Rate and Rhythm: Normal rate and regular rhythm.     Heart sounds: No murmur heard.    No friction rub. No gallop.  Pulmonary:     Effort: Pulmonary effort is normal.     Breath sounds: Normal breath sounds.  Abdominal:     General: Bowel sounds are normal.     Palpations: Abdomen is soft.  Skin:    General: Skin is warm and dry.     Capillary Refill: Capillary refill takes less than 2 seconds.  Neurological:     Mental Status: She is alert and oriented to person, place, and time.     Comments: Cranial nerves II through XII grossly intact.  Intact finger-nose, intact heel-to-shin.  Romberg negative, gait normal.  Alert and oriented x3.  Moves all 4 limbs spontaneously, normal coordination.  No pronator drift.  Intact strength 5 out of 5 bilateral upper and lower extremities.  Psychiatric:        Mood and Affect: Mood normal.        Behavior: Behavior normal.     ED Results / Procedures / Treatments   Labs (  all labs ordered are listed, but only abnormal results are displayed) Labs Reviewed - No data to display  EKG None  Radiology No results found.  Procedures Procedures    Medications Ordered in ED Medications  magnesium sulfate IVPB 2 g 50 mL (2 g Intravenous New Bag/Given 02/21/23 2102)  sodium chloride 0.9 % bolus 1,000 mL (1,000 mLs Intravenous New Bag/Given 02/21/23 2102)  ketorolac (TORADOL) 15 MG/ML injection 15 mg (15 mg Intravenous Given 02/21/23 2056)  prochlorperazine (COMPAZINE) injection 10 mg (10 mg Intravenous Given 02/21/23 2057)  diphenhydrAMINE (BENADRYL) injection 25 mg (25 mg Intravenous Given 02/21/23 2055)  dexamethasone (DECADRON) injection 10 mg (10 mg Intravenous Given 02/21/23 2059)    ED Course/ Medical Decision Making/ A&P Clinical Course as of 02/21/23 2149  Mon Feb 21, 2023   2143 Assumed primary care of the patient.  [RP]    Clinical Course User Index [RP] Rondel Baton, MD                             Medical Decision Making Risk Prescription drug management.   This patient is a 42 y.o. female  who presents to the ED for concern of migraine.   Differential diagnoses prior to evaluation: The emergent differential diagnosis includes, but is not limited to,  Stroke, increased ICP, meningitis, CVA, intracranial tumor, venous sinus thrombosis, migraine, cluster headache, hypertension, drug related, head injury, tension headache, sinusitis, dental abscess, otitis media, TMJ . This is not an exhaustive differential.   Past Medical History / Co-morbidities / Social History: migraines  Additional history: Chart reviewed. Pertinent results include: Reviewed outside records of significant previous evaluations for migraine and multiple treatments including Botox injections  Physical Exam: Physical exam performed. The pertinent findings include: No focal neurologic deficits, patient does have significant sensitivity to light on my exam. her vital signs are stable.  Lab Tests/Imaging studies: Discussed lab work, imaging as patient reports that her headaches are typically mostly unilateral on the left, but she reports that she has occasionally had bilateral headaches such as today, that her symptoms are otherwise consistent with her normal migraines that the symptoms came on gradually not spontaneously all at once.  I think it is reasonable given her significant previous history of migraines with formal evaluation of possible intracranial causes in the past to forego any lab work and imaging today.  Patient understands and agrees with this plan.   Medications: I ordered medication including migraine cocktail for migraine symptoms.  I have reviewed the patients home medicines and have made adjustments as needed.    Care of Natalie Malone transferred to Dr.  Eloise Harman at the end of my shift as the patient will require reassessment once labs/imaging have resulted. Patient presentation, ED course, and plan of care discussed with review of all pertinent labs and imaging. Please see his/her note for further details regarding further ED course and disposition. Plan at time of handoff is recheck after completing migraine cocktail to ensure resolution of symptoms. This may be altered or completely changed at the discretion of the oncoming team pending results of further workup.   Final Clinical Impression(s) / ED Diagnoses Final diagnoses:  None    Rx / DC Orders ED Discharge Orders     None         [place] 02/21/23 2150    Rondel Baton, MD 02/21/23 2351
